# Patient Record
Sex: Female | Born: 2000 | Race: White | Hispanic: No | Marital: Single | Smoking: Never smoker
Health system: Southern US, Community
[De-identification: ages and names within clinical notes are randomized; demographics above are authoritative.]

---
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
## 2021-07-18 DIAGNOSIS — Z0000 Encounter for general adult medical examination without abnormal findings: Secondary | ICD-10-CM | POA: Diagnosis not present

## 2021-07-18 DIAGNOSIS — E559 Vitamin D deficiency, unspecified: Secondary | ICD-10-CM | POA: Diagnosis not present

## 2021-09-17 DIAGNOSIS — N946 Dysmenorrhea, unspecified: Secondary | ICD-10-CM | POA: Diagnosis not present

## 2021-09-17 DIAGNOSIS — Z01419 Encounter for gynecological examination (general) (routine) without abnormal findings: Secondary | ICD-10-CM | POA: Diagnosis not present

## 2021-09-17 DIAGNOSIS — Z6833 Body mass index (BMI) 33.0-33.9, adult: Secondary | ICD-10-CM | POA: Diagnosis not present

## 2022-02-07 DIAGNOSIS — Z0184 Encounter for antibody response examination: Secondary | ICD-10-CM | POA: Diagnosis not present

## 2022-02-07 DIAGNOSIS — Z7184 Encounter for health counseling related to travel: Secondary | ICD-10-CM | POA: Diagnosis not present

## 2022-02-07 DIAGNOSIS — R69 Illness, unspecified: Secondary | ICD-10-CM | POA: Diagnosis not present

## 2022-02-11 DIAGNOSIS — Z23 Encounter for immunization: Secondary | ICD-10-CM | POA: Diagnosis not present

## 2022-07-31 DIAGNOSIS — F411 Generalized anxiety disorder: Secondary | ICD-10-CM | POA: Diagnosis not present

## 2022-10-17 DIAGNOSIS — N946 Dysmenorrhea, unspecified: Secondary | ICD-10-CM | POA: Diagnosis not present

## 2022-10-17 DIAGNOSIS — Z6832 Body mass index (BMI) 32.0-32.9, adult: Secondary | ICD-10-CM | POA: Diagnosis not present

## 2022-10-17 DIAGNOSIS — Z113 Encounter for screening for infections with a predominantly sexual mode of transmission: Secondary | ICD-10-CM | POA: Diagnosis not present

## 2022-10-17 DIAGNOSIS — Z01419 Encounter for gynecological examination (general) (routine) without abnormal findings: Secondary | ICD-10-CM | POA: Diagnosis not present

## 2023-02-09 DIAGNOSIS — Z23 Encounter for immunization: Secondary | ICD-10-CM | POA: Diagnosis not present

## 2023-05-15 ENCOUNTER — Ambulatory Visit
Admission: RE | Admit: 2023-05-15 | Discharge: 2023-05-15 | Disposition: A | Discharge status: Home / Self Care | Source: Ambulatory Visit | Attending: Family Medicine | Admitting: Family Medicine

## 2023-05-15 ENCOUNTER — Other Ambulatory Visit: Payer: Self-pay | Admitting: Family Medicine

## 2023-05-15 DIAGNOSIS — R61 Generalized hyperhidrosis: Secondary | ICD-10-CM | POA: Diagnosis not present

## 2023-09-10 DIAGNOSIS — Z887 Allergy status to serum and vaccine status: Secondary | ICD-10-CM | POA: Diagnosis not present
